# Patient Record
Sex: Male | Born: 1965
Health system: Southern US, Community
[De-identification: ages and names within clinical notes are randomized; demographics above are authoritative.]

## PROBLEM LIST (undated history)

## (undated) DIAGNOSIS — B20 Human immunodeficiency virus [HIV] disease: Secondary | ICD-10-CM

## (undated) DIAGNOSIS — Z21 Asymptomatic human immunodeficiency virus [HIV] infection status: Secondary | ICD-10-CM

## (undated) HISTORY — PX: ABDOMINAL SURGERY: SHX537

---

## 2020-01-01 ENCOUNTER — Ambulatory Visit: Payer: Medicaid Other | Admitting: *Deleted

## 2020-01-01 ENCOUNTER — Ambulatory Visit: Payer: Medicaid Other | Attending: Critical Care Medicine

## 2020-01-01 ENCOUNTER — Other Ambulatory Visit: Payer: Self-pay

## 2020-01-01 DIAGNOSIS — Z23 Encounter for immunization: Secondary | ICD-10-CM

## 2020-01-01 NOTE — Progress Notes (Signed)
   Covid-19 Vaccination Clinic  Name:  Mark Benton    MRN: [id number] DOB: 07/09/1965  02/03/2020  Mr. Mark Benton was observed post Covid-19 immunization for without incident. He was provided with Vaccine Information Sheet and instruction to access the V-Safe system.   Mr. Mark Benton was instructed to call 911 with any severe reactions post vaccine: Difficulty breathing  Swelling of face and throat  A fast heartbeat  A bad rash all over body  Dizziness and weakness   Immunizations Administered     Name Date Dose VIS Date Route   Moderna COVID-19 Vaccine 01/01/2020  4:40 PM 0.5 mL 04/2019 Intramuscular   Manufacturer: Moderna   Lot: [id number]   NDC: 24235-361-44

## 2020-01-08 ENCOUNTER — Observation Stay (HOSPITAL_COMMUNITY)
Admission: EM | Admit: 2020-01-08 | Discharge: 2020-01-09 | Disposition: A | Discharge status: Home / Self Care | Payer: Medicaid Other | Attending: Internal Medicine | Admitting: Internal Medicine

## 2020-01-08 ENCOUNTER — Emergency Department (HOSPITAL_COMMUNITY): Payer: Medicaid Other

## 2020-01-08 ENCOUNTER — Encounter (HOSPITAL_COMMUNITY): Payer: Self-pay

## 2020-01-08 ENCOUNTER — Other Ambulatory Visit: Payer: Self-pay

## 2020-01-08 DIAGNOSIS — Z21 Asymptomatic human immunodeficiency virus [HIV] infection status: Secondary | ICD-10-CM | POA: Diagnosis present

## 2020-01-08 DIAGNOSIS — R0789 Other chest pain: Secondary | ICD-10-CM

## 2020-01-08 DIAGNOSIS — Z20822 Contact with and (suspected) exposure to covid-19: Secondary | ICD-10-CM | POA: Diagnosis not present

## 2020-01-08 DIAGNOSIS — E876 Hypokalemia: Secondary | ICD-10-CM | POA: Diagnosis not present

## 2020-01-08 DIAGNOSIS — R079 Chest pain, unspecified: Principal | ICD-10-CM | POA: Insufficient documentation

## 2020-01-08 DIAGNOSIS — E785 Hyperlipidemia, unspecified: Secondary | ICD-10-CM | POA: Diagnosis present

## 2020-01-08 DIAGNOSIS — R509 Fever, unspecified: Secondary | ICD-10-CM | POA: Insufficient documentation

## 2020-01-08 DIAGNOSIS — F1410 Cocaine abuse, uncomplicated: Secondary | ICD-10-CM | POA: Diagnosis not present

## 2020-01-08 DIAGNOSIS — N390 Urinary tract infection, site not specified: Secondary | ICD-10-CM | POA: Diagnosis present

## 2020-01-08 DIAGNOSIS — N3000 Acute cystitis without hematuria: Secondary | ICD-10-CM

## 2020-01-08 DIAGNOSIS — Z79899 Other long term (current) drug therapy: Secondary | ICD-10-CM | POA: Insufficient documentation

## 2020-01-08 DIAGNOSIS — B20 Human immunodeficiency virus [HIV] disease: Secondary | ICD-10-CM | POA: Diagnosis present

## 2020-01-08 HISTORY — DX: Human immunodeficiency virus (HIV) disease: B20

## 2020-01-08 HISTORY — DX: Asymptomatic human immunodeficiency virus (hiv) infection status: Z21

## 2020-01-08 LAB — COMPREHENSIVE METABOLIC PANEL
ALT: 33 U/L (ref 0–44)
AST: 49 U/L — ABNORMAL HIGH (ref 15–41)
Albumin: 3 g/dL — ABNORMAL LOW (ref 3.5–5.0)
Alkaline Phosphatase: 38 U/L (ref 38–126)
Anion gap: 11 (ref 5–15)
BUN: 10 mg/dL (ref 6–20)
CO2: 22 mmol/L (ref 22–32)
Calcium: 8.6 mg/dL — ABNORMAL LOW (ref 8.9–10.3)
Chloride: 101 mmol/L (ref 98–111)
Creatinine, Ser: 1.19 mg/dL (ref 0.61–1.24)
GFR calc Af Amer: >60 mL/min (ref >60)
GFR calc non Af Amer: >60 mL/min (ref >60)
Glucose, Bld: 100 mg/dL — ABNORMAL HIGH (ref 70–99)
Potassium: 3.1 mmol/L — ABNORMAL LOW (ref 3.5–5.1)
Sodium: 134 mmol/L — ABNORMAL LOW (ref 135–145)
Total Bilirubin: 0.9 mg/dL (ref 0.3–1.2)
Total Protein: 7.8 g/dL (ref 6.5–8.1)

## 2020-01-08 LAB — URINALYSIS, ROUTINE W REFLEX MICROSCOPIC
Bilirubin Urine: NEGATIVE
Glucose, UA: NEGATIVE mg/dL
Hgb urine dipstick: NEGATIVE
Ketones, ur: NEGATIVE mg/dL
Nitrite: POSITIVE — AB
Protein, ur: NEGATIVE mg/dL
Specific Gravity, Urine: 1.013 (ref 1.005–1.030)
pH: 5 (ref 5.0–8.0)

## 2020-01-08 LAB — CBC WITH DIFFERENTIAL/PLATELET
Abs Immature Granulocytes: 0.01 10*3/uL (ref 0.00–0.07)
Basophils Absolute: 0 10*3/uL (ref 0.0–0.1)
Basophils Relative: 0 %
Eosinophils Absolute: 0 10*3/uL (ref 0.0–0.5)
Eosinophils Relative: 0 %
HCT: 32.1 % — ABNORMAL LOW (ref 39.0–52.0)
Hemoglobin: 10.6 g/dL — ABNORMAL LOW (ref 13.0–17.0)
Immature Granulocytes: 0 %
Lymphocytes Relative: 14 %
Lymphs Abs: 1.1 10*3/uL (ref 0.7–4.0)
MCH: 34.1 pg — ABNORMAL HIGH (ref 26.0–34.0)
MCHC: 33 g/dL (ref 30.0–36.0)
MCV: 103.2 fL — ABNORMAL HIGH (ref 80.0–100.0)
Monocytes Absolute: 0.8 10*3/uL (ref 0.1–1.0)
Monocytes Relative: 10 %
Neutro Abs: 6 10*3/uL (ref 1.7–7.7)
Neutrophils Relative %: 76 %
Platelets: 197 10*3/uL (ref 150–400)
RBC: 3.11 MIL/uL — ABNORMAL LOW (ref 4.22–5.81)
RDW: 14.9 % (ref 11.5–15.5)
WBC: 7.9 10*3/uL (ref 4.0–10.5)
nRBC: 0 % (ref 0.0–0.2)

## 2020-01-08 LAB — RAPID URINE DRUG SCREEN, HOSP PERFORMED
Amphetamines: NOT DETECTED
Barbiturates: NOT DETECTED
Benzodiazepines: NOT DETECTED
Cocaine: POSITIVE — AB
Opiates: NOT DETECTED
Tetrahydrocannabinol: NOT DETECTED

## 2020-01-08 LAB — LIPID PANEL
Cholesterol: 121 mg/dL (ref 0–200)
HDL: 57 mg/dL (ref >40)
LDL Cholesterol: 57 mg/dL (ref 0–99)
Total CHOL/HDL Ratio: 2.1 RATIO
Triglycerides: 36 mg/dL (ref <150)
VLDL: 7 mg/dL (ref 0–40)

## 2020-01-08 LAB — TROPONIN I (HIGH SENSITIVITY)
Troponin I (High Sensitivity): 15 ng/L (ref <18)
Troponin I (High Sensitivity): 19 ng/L — ABNORMAL HIGH (ref <18)
Troponin I (High Sensitivity): 16 ng/L (ref <18)

## 2020-01-08 LAB — LACTIC ACID, PLASMA
Lactic Acid, Venous: 2.1 mmol/L (ref 0.5–1.9)
Lactic Acid, Venous: 1 mmol/L (ref 0.5–1.9)

## 2020-01-08 LAB — LIPASE, BLOOD: Lipase: 35 U/L (ref 11–51)

## 2020-01-08 LAB — ETHANOL: Alcohol, Ethyl (B): 23 mg/dL — ABNORMAL HIGH (ref <10)

## 2020-01-08 LAB — SARS CORONAVIRUS 2 BY RT PCR (HOSPITAL ORDER, PERFORMED IN ~~LOC~~ HOSPITAL LAB): SARS Coronavirus 2: NEGATIVE

## 2020-01-08 LAB — POC SARS CORONAVIRUS 2 AG -  ED: SARS Coronavirus 2 Ag: NEGATIVE

## 2020-01-08 MED ORDER — ONDANSETRON HCL 4 MG/2ML IJ SOLN: 4.0000 mg | Freq: Four times a day (QID) | INTRAMUSCULAR | Status: DC | PRN

## 2020-01-08 MED ORDER — ALUM & MAG HYDROXIDE-SIMETH 200-200-20 MG/5ML PO SUSP
30.0000 mL | Freq: Once | ORAL | Status: AC
  Administered 2020-01-08: 30 mL via ORAL
  Filled 2020-01-08: qty 30

## 2020-01-08 MED ORDER — SODIUM CHLORIDE 0.45 % IV SOLN: INTRAVENOUS | Status: DC

## 2020-01-08 MED ORDER — HEPARIN BOLUS VIA INFUSION
4000.0000 [IU] | Freq: Once | INTRAVENOUS | Status: AC
  Administered 2020-01-08: 4000 [IU] via INTRAVENOUS
  Filled 2020-01-08: qty 4000

## 2020-01-08 MED ORDER — EMTRICITAB-RILPIVIR-TENOFOV AF 200-25-25 MG PO TABS
1.0000 | ORAL_TABLET | Freq: Every day | ORAL | Status: DC
  Administered 2020-01-09: 1 via ORAL
  Filled 2020-01-08: qty 1

## 2020-01-08 MED ORDER — ACETAMINOPHEN 325 MG PO TABS: 650.0000 mg | ORAL_TABLET | ORAL | Status: DC | PRN

## 2020-01-08 MED ORDER — DARUNAVIR ETHANOLATE 800 MG PO TABS
800.0000 mg | ORAL_TABLET | Freq: Every day | ORAL | Status: DC
  Administered 2020-01-09: 800 mg via ORAL
  Filled 2020-01-08 (×2): qty 1

## 2020-01-08 MED ORDER — NITROGLYCERIN 0.4 MG SL SUBL: 0.4000 mg | SUBLINGUAL_TABLET | SUBLINGUAL | Status: DC | PRN

## 2020-01-08 MED ORDER — SODIUM CHLORIDE 0.9 % IV BOLUS
500.0000 mL | Freq: Once | INTRAVENOUS | Status: AC
  Administered 2020-01-08: 500 mL via INTRAVENOUS

## 2020-01-08 MED ORDER — IOHEXOL 350 MG/ML SOLN
100.0000 mL | Freq: Once | INTRAVENOUS | Status: AC | PRN
  Administered 2020-01-08: 100 mL via INTRAVENOUS

## 2020-01-08 MED ORDER — HYDROCODONE-ACETAMINOPHEN 5-325 MG PO TABS: 1.0000 | ORAL_TABLET | Freq: Four times a day (QID) | ORAL | Status: DC | PRN

## 2020-01-08 MED ORDER — RITONAVIR 100 MG PO TABS
100.0000 mg | ORAL_TABLET | Freq: Every day | ORAL | Status: DC
  Administered 2020-01-09: 100 mg via ORAL
  Filled 2020-01-08 (×2): qty 1

## 2020-01-08 MED ORDER — LIDOCAINE VISCOUS HCL 2 % MT SOLN
15.0000 mL | Freq: Once | OROMUCOSAL | Status: AC
  Administered 2020-01-08: 15 mL via ORAL
  Filled 2020-01-08: qty 15

## 2020-01-08 MED ORDER — SULFAMETHOXAZOLE-TRIMETHOPRIM 800-160 MG PO TABS
1.0000 | ORAL_TABLET | Freq: Every day | ORAL | Status: DC
  Administered 2020-01-08 – 2020-01-09 (×2): 1 via ORAL
  Filled 2020-01-08 (×2): qty 1

## 2020-01-08 MED ORDER — HEPARIN (PORCINE) 25000 UT/250ML-% IV SOLN
1050.0000 [IU]/h | INTRAVENOUS | Status: DC
  Administered 2020-01-08: 850 [IU]/h via INTRAVENOUS
  Filled 2020-01-08: qty 250

## 2020-01-08 NOTE — Progress Notes (Signed)
ANTICOAGULATION CONSULT NOTE - Initial Consult  Pharmacy Consult for heparin Indication: chest pain/ACS  No Known Allergies  Patient Measurements: Height: 6' (182.9 cm) Weight: 72.6 kg (160 lb) IBW/kg (Calculated) : 77.6 Heparin Dosing Weight: 72.6 kg  Vital Signs: Temp: 99.1 F (37.3 C) (08/12 1645) Temp Source: Oral (08/12 1645) BP: 107/73 (08/12 1914) Pulse Rate: 73 (08/12 1914)  Labs: Recent Labs    01/08/20 1406 01/08/20 1606  HGB 10.6*  --   HCT 32.1*  --   PLT 197  --   CREATININE 1.19  --   TROPONINIHS 15 19*    Estimated Creatinine Clearance: 71.2 mL/min (by C-G formula based on SCr of 1.19 mg/dL).   Medical History: Past Medical History:  Diagnosis Date  . HIV (human immunodeficiency virus infection) [facility])     Assessment: 54 yo male with a history of HIV presents to the ED with chest pain. High sensitivity troponin upon admission was 15 and increased to 19. PTA the patient is not on any anticoagulation. The patients hemoglobin is 10.6 and hematocrit is 32.1. The patient did not report and there were no signs or symptoms of bleeding documented. Per the hospitalist, the patient will be admitted to rule out a myocardial infarction. Pharmacy is now consulted to dose heparin for ACS.  Goal of Therapy:  Heparin level 0.3-0.7 units/ml Monitor platelets by anticoagulation protocol: Yes   Plan:  Heparin IV 4000 unit bolus x1 dose, then 850 units/hr Check 6-hr heparin level Monitor daily heparin level and CBC Monitor for signs and symptoms of bleeding  Sanda Klein, PharmD, RPh  PGY-1 Pharmacy Resident 01/08/2020 8:27 PM  Please check [email or web address] for unit-specific pharmacy phone numbers.

## 2020-01-08 NOTE — ED Provider Notes (Signed)
[facility] EMERGENCY DEPARTMENT Provider Note   CSN: [id number] Arrival date & time: 01/08/20  1356     History Chief Complaint  Patient presents with  . Chest Pain  . Shortness of Breath    Mark Benton is a 54 y.o. male presenting for evaluation of chest pain.  Patient states around 12:00 this afternoon he was working outside when he had acute onset chest pain.  He states pain radiates to his back.  He is associated shortness of breath and nausea.  He denies history of similar pain.  He states he feels like he developed a fever today.  He denies trauma, injury, cough, abdominal pain, urinary symptoms, abnormal bowel movements.  Reports a history of HIV, states he is compliant with his antivirals, however has been out of his prophylactic antibiotics for about a month.  He reports intermittent alcohol use, last drink today.  He denies drug use.  Additional history obtained from chart review.  Reviewed patient's recent visits in the [facility] system.  Patient was admitted beginning of July for possible perf'd duodenal ulcer.  Reviewed previous visits for chest pain as well as polysubstance abuse including cocaine use.  Patient recently with SI.   HPI     Past Medical History:  Diagnosis Date  . HIV (human immunodeficiency virus infection) [facility])     Patient Active Problem List   Diagnosis Date Noted  . Chest pain 01/08/2020    Past Surgical History:  Procedure Laterality Date  . ABDOMINAL SURGERY         History reviewed. No pertinent family history.  Social History   Tobacco Use  . Smoking status: Not on file  Substance Use Topics  . Alcohol use: Not on file  . Drug use: Not on file    Home Medications Prior to Admission medications   Medication Sig Start Date End Date Taking? Authorizing Provider  emtricitabine-rilpivir-tenofovir AF (ODEFSEY) 200-25-25 MG TABS tablet Take 1 tablet by mouth daily. 12/27/19 01/27/20 Yes [provider]  HYDROcodone-acetaminophen (NORCO/VICODIN) 5-325 MG tablet Take 1 tablet by mouth every 6 (six) hours as needed (for pain).  12/08/19  Yes [provider]  PREZISTA 800 MG tablet Take 800 mg by mouth daily. 10/28/19  Yes [provider]  ritonavir (NORVIR) 100 MG TABS tablet Take 100 mg by mouth daily. 10/24/19  Yes [provider]  sulfamethoxazole-trimethoprim (BACTRIM DS) 800-160 MG tablet Take 1 tablet by mouth daily. 11/25/19  Yes [provider]    Allergies    Patient has no known allergies.  Review of Systems   Review of Systems  Constitutional: Positive for fever.  Respiratory: Positive for shortness of breath.   Cardiovascular: Positive for chest pain.  All other systems reviewed and are negative.   Physical Exam Updated Vital Signs BP 107/69   Pulse 71   Temp 99.1 F (37.3 C) (Oral)   Resp 18   Ht 6' (1.829 m)   Wt 72.6 kg   SpO2 100%   BMI 21.70 kg/m   Physical Exam Vitals and nursing note reviewed.  Constitutional:      General: He is not in acute distress.    Appearance: He is well-developed.     Comments: Appears uncomfortable due to pain, otherwise nontoxic  HENT:     Head: Normocephalic and atraumatic.  Eyes:     Extraocular Movements: Extraocular movements intact.     Conjunctiva/sclera: Conjunctivae normal.     Pupils: Pupils  are equal, round, and reactive to light.  Cardiovascular:     Rate and Rhythm: Normal rate and regular rhythm.     Pulses: Normal pulses.  Pulmonary:     Effort: Pulmonary effort is normal. No respiratory distress.     Breath sounds: Normal breath sounds. No wheezing.     Comments: Clear lung sounds Abdominal:     General: There is no distension.     Palpations: Abdomen is soft. There is no mass.     Tenderness: There is abdominal tenderness. There is no guarding or rebound.     Comments: Tenderness palpation of epigastric abdomen, however also recently had surgery, scar noted.   Musculoskeletal:        General: Normal range of motion.     Cervical back: Normal range of motion and neck supple.     Right lower leg: No edema.     Left lower leg: No edema.  Skin:    General: Skin is warm and dry.     Capillary Refill: Capillary refill takes less than 2 seconds.  Neurological:     Mental Status: He is alert and oriented to person, place, and time.     ED Results / Procedures / Treatments   Labs (all labs ordered are listed, but only abnormal results are displayed) Labs Reviewed  CBC WITH DIFFERENTIAL/PLATELET - Abnormal; Notable for the following components:      Result Value   RBC 3.11 (*)    Hemoglobin 10.6 (*)    HCT 32.1 (*)    MCV 103.2 (*)    MCH 34.1 (*)    All other components within normal limits  COMPREHENSIVE METABOLIC PANEL - Abnormal; Notable for the following components:   Sodium 134 (*)    Potassium 3.1 (*)    Glucose, Bld 100 (*)    Calcium 8.6 (*)    Albumin 3.0 (*)    AST 49 (*)    All other components within normal limits  LACTIC ACID, PLASMA - Abnormal; Notable for the following components:   Lactic Acid, Venous 2.1 (*)    All other components within normal limits  ETHANOL - Abnormal; Notable for the following components:   Alcohol, Ethyl (B) 23 (*)    All other components within normal limits  TROPONIN I (HIGH SENSITIVITY) - Abnormal; Notable for the following components:   Troponin I (High Sensitivity) 19 (*)    All other components within normal limits  SARS CORONAVIRUS 2 BY RT PCR (HOSPITAL ORDER, PERFORMED IN [facility])  LACTIC ACID, PLASMA  LIPASE, BLOOD  RAPID URINE DRUG SCREEN, HOSP PERFORMED  URINALYSIS, ROUTINE W REFLEX MICROSCOPIC  POC SARS CORONAVIRUS 2 AG -  ED  TROPONIN I (HIGH SENSITIVITY)    EKG EKG Interpretation  Date/Time:  Thursday January 08 2020 14:04:19 EDT Ventricular Rate:  79 PR Interval:    QRS Duration: 76 QT Interval:  369 QTC Calculation: 423 R Axis:   47 Text  Interpretation: Sinus rhythm Abnormal T, consider ischemia, diffuse leads Confirmed by Margarita Grizzleay, Danielle [phone]([id number]) on 01/08/2020 2:51:12 PM   Radiology DG Chest Portable 1 View  Result Date: 01/08/2020 CLINICAL DATA:  Chest pain EXAM: PORTABLE CHEST 1 VIEW COMPARISON:  None. FINDINGS: The heart size and mediastinal contours are within normal limits. Both lungs are clear. No pleural effusion or pneumothorax. The visualized skeletal structures are unremarkable. IMPRESSION: No acute process in the chest. Electronically Signed   By: Guadlupe SpanishPraneil  Patel M.D.   On:  01/08/2020 14:57   CT Angio Chest/Abd/Pel for Dissection W and/or Wo Contrast  Result Date: 01/08/2020 CLINICAL DATA:  Abdominal pain.  Aortic dissection suspected. EXAM: CT ANGIOGRAPHY CHEST, ABDOMEN AND PELVIS TECHNIQUE: Non-contrast CT of the chest was initially obtained. Multidetector CT imaging through the chest, abdomen and pelvis was performed using the standard protocol during bolus administration of intravenous contrast. Multiplanar reconstructed images and MIPs were obtained and reviewed to evaluate the vascular anatomy. CONTRAST:  OMNIPAQUE IOHEXOL 350 MG/ML SOLN COMPARISON:  None. FINDINGS: CTA CHEST FINDINGS Cardiovascular: Heart is normal size. Aorta is normal caliber. No dissection. No filling defects in the pulmonary arteries to suggest pulmonary emboli. Mediastinum/Nodes: No mediastinal, hilar, or axillary adenopathy. Trachea and esophagus are unremarkable. Thyroid unremarkable. Lungs/Pleura: Minimal dependent and bibasilar atelectasis. No confluent opacities or effusions. Musculoskeletal: Chest wall soft tissues are unremarkable. No acute bony abnormality. Review of the MIP images confirms the above findings. CTA ABDOMEN AND PELVIS FINDINGS VASCULAR Aorta: Normal caliber aorta without aneurysm, dissection, vasculitis or significant stenosis. Celiac: Patent without evidence of aneurysm, dissection, vasculitis or significant stenosis. SMA:  Patent without evidence of aneurysm, dissection, vasculitis or significant stenosis. Renals: Both renal arteries are patent without evidence of aneurysm, dissection, vasculitis, fibromuscular dysplasia or significant stenosis. IMA: Patent without evidence of aneurysm, dissection, vasculitis or significant stenosis. Inflow: Patent without evidence of aneurysm, dissection, vasculitis or significant stenosis. Veins: No obvious venous abnormality within the limitations of this arterial phase study. Review of the MIP images confirms the above findings. NON-VASCULAR Hepatobiliary: No focal hepatic abnormality. Gallbladder unremarkable. Pancreas: No focal abnormality or ductal dilatation. Spleen: No focal abnormality.  Normal size. Adrenals/Urinary Tract: No adrenal abnormality. No focal renal abnormality. No stones or hydronephrosis. Urinary bladder is unremarkable. Stomach/Bowel: Stomach, large and small bowel grossly unremarkable. Normal appendix. Lymphatic: No adenopathy. Reproductive: No visible focal abnormality. Other: No free fluid or free air. Musculoskeletal: No acute bony abnormality. Review of the MIP images confirms the above findings. IMPRESSION: No evidence of aortic aneurysm or dissection. No evidence of pulmonary embolus. No acute cardiopulmonary disease. Dependent and bibasilar atelectasis. No acute findings in the abdomen or pelvis. Electronically Signed   By: Charlett Nose M.D.   On: 01/08/2020 17:48    Procedures Procedures (including critical care time)  Medications Ordered in ED Medications  nitroGLYCERIN (NITROSTAT) SL tablet 0.4 mg (has no administration in time range)  sodium chloride 0.9 % bolus 500 mL (0 mLs Intravenous Stopped 01/08/20 1743)  iohexol (OMNIPAQUE) 350 MG/ML injection 100 mL (100 mLs Intravenous Contrast Given 01/08/20 1737)    ED Course  I have reviewed the triage vital signs and the nursing notes.  Pertinent labs & imaging results that were available during my care  of the patient were reviewed by me and considered in my medical decision making (see chart for details).    MDM Rules/Calculators/A&P                          Patient presenting for evaluation of chest pain.  On exam, patient appears extremely comfortable.  However, he is also febrile to 102.7.  He reports his pain radiates to his back.  Consider viral infection such as Covid.  Consider pneumonia.  Consider dissection, although less likely in the setting of fever.  Consider pancreatitis due to patient's history of alcohol use.  Patient also recently had a laparotomy for perforated bowel, consider surgical complication/repeat perf bowl.  Additionally, patient is HIV positive, consider  atypical infection such as PCP.  Consider ACS, patient has a history of cocaine use will obtain labs, x-ray, Covid test, CT dissection study.  Rapid Covid negative, will send PCR.  EKG shows T wave abnormalities.  Unable to compare to previous.  Chest x-ray viewed interpreted by me, no pneumonia, proximal effusion, cardiomegaly.  Initial round of labs overall reassuring. hgb slightly low at 10.6, this is a change when compared to previous at 13. Consider PUD/gastritis.   Ethanol mildly elevated at 23.  Trop 17.  Will delta due to acute onset of pain.  Initial lactic elevated at 2.1.  Repeat lactic negative at 1.0.  Repeat troponin mildly elevated at 19.  Per heart pathway, recommend obvs admission for chest pain.  Patient's fever improved with Tylenol given with EMS.  On reassessment, patient reports pain is improved, now rates it a 6 out of 10.  CT dissection study negative for acute findings.  Discussed with patient, will call for admission.  Discussed with Dr. Mikeal Hawthorne from [facility] hospitalist service, patient to be admitted.  Final Clinical Impression(s) / ED Diagnoses Final diagnoses:  Chest pain, unspecified type    Rx / DC Orders ED Discharge Orders    None       Alveria Apley, PA-C 01/08/20 1844      Margarita Grizzle, MD 01/12/20 1414

## 2020-01-08 NOTE — ED Notes (Signed)
Pt refuse lactic acid drawn, ask to be pulled out from IV.

## 2020-01-08 NOTE — ED Triage Notes (Signed)
Pt bib ems from [facility] for chest pain and SOB that started around noon today while working outside. Pt also febrile at 101 with EMS. Other VSS, given 324 ASA and 1G tylenol en route. Hx of HIV. 18G RAC

## 2020-01-08 NOTE — ED Notes (Signed)
Patient transported to CT 

## 2020-01-08 NOTE — H&P (Signed)
History and Physical   Mark BrothersDarryl Kunst VFI:[id number] DOB: Oct 21, 1965 DOA: 01/08/2020  Referring MD/NP/PA: Dr. Hipolito Bayleyaniel Ray  PCP: Patient, No Pcp Per   Outpatient Specialists: [facility]  Patient coming from: Home  Chief Complaint: Chest pain or shortness of breath  HPI: Mark Benton is a 54 y.o. male with medical history significant of HIV disease who [facility], previous chest pain with some work-up, unknown cholesterol status who presents to the ER with progressive shortness of breath and chest pain that started this afternoon. Is still describing it as 6 out of 10 in the left side of the chest. Denied any radiation. No relation with food. Worsened by any activity not relieved by rest. He has been taking his HIV medications and has been doing follow-up according to him. Patient was seen in the ER evaluated with initial troponin negative 2nd troponin slightly elevated to 19. He is deemed to have some risk factors for coronary artery disease and is being admitted to the hospital for rule out MI. No nausea vomiting or diarrhea no diaphoresis..  ED Course: Temperature 102.7 blood pressure 107/73 with pulse 78 respirate of 20 oxygen sat 98% room air. White count 7.9 hemoglobin 10.6 platelets 197 sodium 134 potassium is 3.0 chloride 101 CO2 22 with calcium 8.6 creatinine 1.19. Urinalysis shows positive nitrite many bacteria WBC 20-50, urine drug screen is positive for cocaine. UTI suspected as cause of fever and cocaine as cause of chest pain most likely. Patient being admitted for rule out MI.  Review of Systems: As per HPI otherwise 10 point review of systems negative.    Past Medical History:  Diagnosis Date  . HIV (human immunodeficiency virus infection) (HCC)     Past Surgical History:  Procedure Laterality Date  . ABDOMINAL SURGERY       has no history on file for tobacco use, alcohol use, and drug use.  No  Known Allergies  History reviewed. No pertinent family history.   Prior to Admission medications   Medication Sig Start Date End Date Taking? Authorizing Provider  emtricitabine-rilpivir-tenofovir AF (ODEFSEY) 200-25-25 MG TABS tablet Take 1 tablet by mouth daily. 12/27/19 01/27/20 Yes [provider]  HYDROcodone-acetaminophen (NORCO/VICODIN) 5-325 MG tablet Take 1 tablet by mouth every 6 (six) hours as needed (for pain).  12/08/19  Yes [provider]  PREZISTA 800 MG tablet Take 800 mg by mouth daily. 10/28/19  Yes [provider]  ritonavir (NORVIR) 100 MG TABS tablet Take 100 mg by mouth daily. 10/24/19  Yes [provider]  sulfamethoxazole-trimethoprim (BACTRIM DS) 800-160 MG tablet Take 1 tablet by mouth daily. 11/25/19  Yes [provider]    Physical Exam: Vitals:   01/08/20 1715 01/08/20 1716 01/08/20 1815 01/08/20 1914  BP: 107/69  104/78 107/73  Pulse:  71 70 73  Resp:   18 16  Temp:      TempSrc:      SpO2:  100% 100% 98%  Weight:      Height:          Constitutional: NAD, calm, comfortable Vitals:   01/08/20 1715 01/08/20 1716 01/08/20 1815 01/08/20 1914  BP: 107/69  104/78 107/73  Pulse:  71 70 73  Resp:   18 16  Temp:      TempSrc:      SpO2:  100% 100% 98%  Weight:      Height:  Eyes: PERRL, lids and conjunctivae normal ENMT: Mucous membranes are moist. Posterior pharynx clear of any exudate or lesions.Normal dentition.  Neck: normal, supple, no masses, no thyromegaly Respiratory: clear to auscultation bilaterally, no wheezing, no crackles. Normal respiratory effort. No accessory muscle use.  Cardiovascular: Regular rate and rhythm, no murmurs / rubs / gallops. No extremity edema. 2+ pedal pulses. No carotid bruits.  Abdomen: no tenderness, no masses palpated. No hepatosplenomegaly. Bowel sounds positive.  Musculoskeletal: no clubbing / cyanosis. No joint deformity upper and lower extremities. Good ROM, no  contractures. Normal muscle tone.  Skin: no rashes, lesions, ulcers. No induration Neurologic: CN 2-12 grossly intact. Sensation intact, DTR normal. Strength 5/5 in all 4.  Psychiatric: Normal judgment and insight. Alert and oriented x 3. Normal mood.     Labs on Admission: I have personally reviewed following labs and imaging studies  CBC: Recent Labs  Lab 01/08/20 1406  WBC 7.9  NEUTROABS 6.0  HGB 10.6*  HCT 32.1*  MCV 103.2*  PLT 197   Basic Metabolic Panel: Recent Labs  Lab 01/08/20 1406  NA 134*  K 3.1*  CL 101  CO2 22  GLUCOSE 100*  BUN 10  CREATININE 1.19  CALCIUM 8.6*   GFR: Estimated Creatinine Clearance: 71.2 mL/min (by C-G formula based on SCr of 1.19 mg/dL). Liver Function Tests: Recent Labs  Lab 01/08/20 1406  AST 49*  ALT 33  ALKPHOS 38  BILITOT 0.9  PROT 7.8  ALBUMIN 3.0*   Recent Labs  Lab 01/08/20 1406  LIPASE 35   No results for input(s): AMMONIA in the last 168 hours. Coagulation Profile: No results for input(s): INR, PROTIME in the last 168 hours. Cardiac Enzymes: No results for input(s): CKTOTAL, CKMB, CKMBINDEX, TROPONINI in the last 168 hours. BNP (last 3 results) No results for input(s): PROBNP in the last 8760 hours. HbA1C: No results for input(s): HGBA1C in the last 72 hours. CBG: No results for input(s): GLUCAP in the last 168 hours. Lipid Profile: No results for input(s): CHOL, HDL, LDLCALC, TRIG, CHOLHDL, LDLDIRECT in the last 72 hours. Thyroid Function Tests: No results for input(s): TSH, T4TOTAL, FREET4, T3FREE, THYROIDAB in the last 72 hours. Anemia Panel: No results for input(s): VITAMINB12, FOLATE, FERRITIN, TIBC, IRON, RETICCTPCT in the last 72 hours. Urine analysis:    Component Value Date/Time   COLORURINE YELLOW 01/08/2020 1825   APPEARANCEUR HAZY (A) 01/08/2020 1825   LABSPEC 1.013 01/08/2020 1825   PHURINE 5.0 01/08/2020 1825   GLUCOSEU NEGATIVE 01/08/2020 1825   HGBUR NEGATIVE 01/08/2020 1825    BILIRUBINUR NEGATIVE 01/08/2020 1825   KETONESUR NEGATIVE 01/08/2020 1825   PROTEINUR NEGATIVE 01/08/2020 1825   NITRITE POSITIVE (A) 01/08/2020 1825   LEUKOCYTESUR TRACE (A) 01/08/2020 1825   Sepsis Labs: @LABRCNTIP (procalcitonin:4,lacticidven:4) ) Recent Results (from the past 240 hour(s))  SARS Coronavirus 2 by RT PCR (hospital order, performed in [facility] hospital lab) Nasopharyngeal Nasopharyngeal Swab     Status: None   Collection Time: 01/08/20  2:47 PM   Specimen: Nasopharyngeal Swab  Result Value Ref Range Status   SARS Coronavirus 2 NEGATIVE NEGATIVE Final    Comment: (NOTE) SARS-CoV-2 target nucleic acids are NOT DETECTED.  The SARS-CoV-2 RNA is generally detectable in upper and lower respiratory specimens during the acute phase of infection. The lowest concentration of SARS-CoV-2 viral copies this assay can detect is 250 copies / mL. A negative result does not preclude SARS-CoV-2 infection and should not be used as the sole basis for treatment  or other patient management decisions.  A negative result may occur with improper specimen collection / handling, submission of specimen other than nasopharyngeal swab, presence of viral mutation(s) within the areas targeted by this assay, and inadequate number of viral copies (<250 copies / mL). A negative result must be combined with clinical observations, patient history, and epidemiological information.  Fact Sheet for Patients:   BoilerBrush.com.cy  Fact Sheet for Healthcare Providers: https://pope.com/  This test is not yet approved or  cleared by the [place] FDA and has been authorized for detection and/or diagnosis of SARS-CoV-2 by FDA under an Emergency Use Authorization (EUA).  This EUA will remain in effect (meaning this test can be used) for the duration of the COVID-19 declaration under Section 564(b)(1) of the Act, 21 U.S.C. section 360bbb-3(b)(1), unless  the authorization is terminated or revoked sooner.  Performed at [facility], [address], [place], [place] [address]      Radiological Exams on Admission: DG Chest Portable 1 View  Result Date: 01/08/2020 CLINICAL DATA:  Chest pain EXAM: PORTABLE CHEST 1 VIEW COMPARISON:  None. FINDINGS: The heart size and mediastinal contours are within normal limits. Both lungs are clear. No pleural effusion or pneumothorax. The visualized skeletal structures are unremarkable. IMPRESSION: No acute process in the chest. Electronically Signed   By: Guadlupe Spanish M.D.   On: 01/08/2020 14:57   CT Angio Chest/Abd/Pel for Dissection W and/or Wo Contrast  Result Date: 01/08/2020 CLINICAL DATA:  Abdominal pain.  Aortic dissection suspected. EXAM: CT ANGIOGRAPHY CHEST, ABDOMEN AND PELVIS TECHNIQUE: Non-contrast CT of the chest was initially obtained. Multidetector CT imaging through the chest, abdomen and pelvis was performed using the standard protocol during bolus administration of intravenous contrast. Multiplanar reconstructed images and MIPs were obtained and reviewed to evaluate the vascular anatomy. CONTRAST:  OMNIPAQUE IOHEXOL 350 MG/ML SOLN COMPARISON:  None. FINDINGS: CTA CHEST FINDINGS Cardiovascular: Heart is normal size. Aorta is normal caliber. No dissection. No filling defects in the pulmonary arteries to suggest pulmonary emboli. Mediastinum/Nodes: No mediastinal, hilar, or axillary adenopathy. Trachea and esophagus are unremarkable. Thyroid unremarkable. Lungs/Pleura: Minimal dependent and bibasilar atelectasis. No confluent opacities or effusions. Musculoskeletal: Chest wall soft tissues are unremarkable. No acute bony abnormality. Review of the MIP images confirms the above findings. CTA ABDOMEN AND PELVIS FINDINGS VASCULAR Aorta: Normal caliber aorta without aneurysm, dissection, vasculitis or significant stenosis. Celiac: Patent without evidence of aneurysm, dissection, vasculitis or  significant stenosis. SMA: Patent without evidence of aneurysm, dissection, vasculitis or significant stenosis. Renals: Both renal arteries are patent without evidence of aneurysm, dissection, vasculitis, fibromuscular dysplasia or significant stenosis. IMA: Patent without evidence of aneurysm, dissection, vasculitis or significant stenosis. Inflow: Patent without evidence of aneurysm, dissection, vasculitis or significant stenosis. Veins: No obvious venous abnormality within the limitations of this arterial phase study. Review of the MIP images confirms the above findings. NON-VASCULAR Hepatobiliary: No focal hepatic abnormality. Gallbladder unremarkable. Pancreas: No focal abnormality or ductal dilatation. Spleen: No focal abnormality.  Normal size. Adrenals/Urinary Tract: No adrenal abnormality. No focal renal abnormality. No stones or hydronephrosis. Urinary bladder is unremarkable. Stomach/Bowel: Stomach, large and small bowel grossly unremarkable. Normal appendix. Lymphatic: No adenopathy. Reproductive: No visible focal abnormality. Other: No free fluid or free air. Musculoskeletal: No acute bony abnormality. Review of the MIP images confirms the above findings. IMPRESSION: No evidence of aortic aneurysm or dissection. No evidence of pulmonary embolus. No acute cardiopulmonary disease. Dependent and bibasilar atelectasis. No acute findings in the  abdomen or pelvis. Electronically Signed   By: Charlett Nose M.D.   On: 01/08/2020 17:48    EKG: Independently reviewed. It shows sinus rhythm with a rate of 79. Evidence of LVH by voltage criteria. Flipped T waves in the lateral leads and diffuse ST depression. No old EKG to compare.  Assessment/Plan Principal Problem:   Chest pain Active Problems:   HIV (human immunodeficiency virus infection) (HCC)   Hyperlipemia     #1 chest pain: Suspected cocaine induced non-ST elevation MI. Patient 2nd troponin is slightly elevated. I will initiate heparin drip  aspirin as well as avoiding beta-blocker. I will admit the patient to the floor for rule out MI. Will consider cardiology consult if he rules in.  #2. HIV disease: Continue with his home regimen  #3 cocaine abuse: Counseling provided.  #4 UTI: Empirically start Rocephin.  #5 hyperlipidemia: Continue with statin.  #6 hypokalemia: Replete potassium   DVT prophylaxis: Heparin drip Code Status: Full code Family Communication: No family at bedside Disposition Plan: Home Consults called: None but may need cardiology consult Admission status: Observation  Severity of Illness: The appropriate patient status for this patient is OBSERVATION. Observation status is judged to be reasonable and necessary in order to provide the required intensity of service to ensure the patient's safety. The patient's presenting symptoms, physical exam findings, and initial radiographic and laboratory data in the context of their medical condition is felt to place them at decreased risk for further clinical deterioration. Furthermore, it is anticipated that the patient will be medically stable for discharge from the hospital within 2 midnights of admission. The following factors support the patient status of observation.   " The patient's presenting symptoms include chest pain or shortness of breath. " The physical exam findings include Tachycardia. " The initial radiographic and laboratory data are Change in EKG, mildly increased Troponin.Marland Kitchen     Lonia Blood MD [facility] Pager [phone]  If 7PM-7AM, please contact night-coverage [email or web address] Password [email or web address]  01/08/2020, 8:16 PM

## 2020-01-09 ENCOUNTER — Encounter (HOSPITAL_COMMUNITY): Payer: Self-pay | Admitting: Internal Medicine

## 2020-01-09 ENCOUNTER — Observation Stay (HOSPITAL_BASED_OUTPATIENT_CLINIC_OR_DEPARTMENT_OTHER): Payer: Medicaid Other

## 2020-01-09 DIAGNOSIS — F1410 Cocaine abuse, uncomplicated: Secondary | ICD-10-CM | POA: Diagnosis not present

## 2020-01-09 DIAGNOSIS — R079 Chest pain, unspecified: Secondary | ICD-10-CM | POA: Diagnosis not present

## 2020-01-09 DIAGNOSIS — Z21 Asymptomatic human immunodeficiency virus [HIV] infection status: Secondary | ICD-10-CM | POA: Diagnosis not present

## 2020-01-09 LAB — BASIC METABOLIC PANEL
Anion gap: 9 (ref 5–15)
BUN: 9 mg/dL (ref 6–20)
CO2: 25 mmol/L (ref 22–32)
Calcium: 8.6 mg/dL — ABNORMAL LOW (ref 8.9–10.3)
Chloride: 104 mmol/L (ref 98–111)
Creatinine, Ser: 1.04 mg/dL (ref 0.61–1.24)
GFR calc Af Amer: >60 mL/min (ref >60)
GFR calc non Af Amer: >60 mL/min (ref >60)
Glucose, Bld: 102 mg/dL — ABNORMAL HIGH (ref 70–99)
Potassium: 3.3 mmol/L — ABNORMAL LOW (ref 3.5–5.1)
Sodium: 138 mmol/L (ref 135–145)

## 2020-01-09 LAB — ECHOCARDIOGRAM COMPLETE
Area-P 1/2: 5.27 cm2
Height: 73 in
S' Lateral: 2.7 cm
Weight: 2283.2 oz

## 2020-01-09 LAB — CBC
HCT: 34 % — ABNORMAL LOW (ref 39.0–52.0)
Hemoglobin: 11.1 g/dL — ABNORMAL LOW (ref 13.0–17.0)
MCH: 33.6 pg (ref 26.0–34.0)
MCHC: 32.6 g/dL (ref 30.0–36.0)
MCV: 103 fL — ABNORMAL HIGH (ref 80.0–100.0)
Platelets: 210 10*3/uL (ref 150–400)
RBC: 3.3 MIL/uL — ABNORMAL LOW (ref 4.22–5.81)
RDW: 15 % (ref 11.5–15.5)
WBC: 5.6 10*3/uL (ref 4.0–10.5)
nRBC: 0 % (ref 0.0–0.2)

## 2020-01-09 LAB — HEPARIN LEVEL (UNFRACTIONATED): Heparin Unfractionated: 0.16 IU/mL — ABNORMAL LOW (ref 0.30–0.70)

## 2020-01-09 LAB — MAGNESIUM: Magnesium: 1.5 mg/dL — ABNORMAL LOW (ref 1.7–2.4)

## 2020-01-09 MED ORDER — SULFAMETHOXAZOLE-TRIMETHOPRIM 800-160 MG PO TABS: 1.0000 | ORAL_TABLET | Freq: Every day | ORAL | 0 refills | Status: AC

## 2020-01-09 MED ORDER — SULFAMETHOXAZOLE-TRIMETHOPRIM 800-160 MG PO TABS
1.0000 | ORAL_TABLET | Freq: Two times a day (BID) | ORAL | 0 refills | Status: AC
Start: 2020-01-09 — End: 2020-01-12

## 2020-01-09 MED ORDER — MAGNESIUM OXIDE -MG SUPPLEMENT 400 (240 MG) MG PO TABS: 400.0000 mg | ORAL_TABLET | Freq: Every day | ORAL | 0 refills | Status: AC

## 2020-01-09 MED ORDER — POTASSIUM CHLORIDE CRYS ER 20 MEQ PO TBCR
40.0000 meq | EXTENDED_RELEASE_TABLET | Freq: Two times a day (BID) | ORAL | Status: DC
  Administered 2020-01-09: 40 meq via ORAL
  Filled 2020-01-09: qty 2

## 2020-01-09 MED FILL — SULFAMETHOXAZOLE-TMP DS TAB: 800-160 | 30 days supply | Qty: 36 | Fill #0

## 2020-01-09 MED FILL — MAGNESIUM OXIDE 400 MG TAB: 400 (240 MG | 3 days supply | Qty: 3 | Fill #0

## 2020-01-09 NOTE — Progress Notes (Signed)
°  Echocardiogram 2D Echocardiogram has been performed.  Delcie Roch 01/09/2020, 9:21 AM

## 2020-01-09 NOTE — Progress Notes (Addendum)
Patient scheduled for heparin draw this morning. Patient refused stating he had been stuck too many times. RN requested phlebotomy to try again at a later time.  Pharmacist notified.   0457-Patient is agreeable at this time to a blood draw around 0800.

## 2020-01-09 NOTE — Progress Notes (Signed)
ANTICOAGULATION CONSULT NOTE  Pharmacy Consult for heparin Indication: chest pain/ACS  No Known Allergies  Patient Measurements: Height: 6\' 1"  (185.4 cm) Weight: 64.7 kg (142 lb 11.2 oz) IBW/kg (Calculated) : 79.9 Heparin Dosing Weight: 64.7 kg  Vital Signs: Temp: 98.3 F (36.8 C) (08/13 0718) Temp Source: Oral (08/13 0718) BP: 99/71 (08/13 0718) Pulse Rate: 51 (08/13 0718)  Labs: Recent Labs    01/08/20 1406 01/08/20 1606 01/08/20 2236 01/09/20 0806  HGB 10.6*  --   --  11.1*  HCT 32.1*  --   --  34.0*  PLT 197  --   --  210  HEPARINUNFRC  --   --   --  0.16*  CREATININE 1.19  --   --   --   TROPONINIHS 15 19* 16  --     Estimated Creatinine Clearance: 63.4 mL/min (by C-G formula based on SCr of 1.19 mg/dL).   Medical History: Past Medical History:  Diagnosis Date   HIV (human immunodeficiency virus infection) (HCC)     Assessment: 54 yo male with history of HIV presents to the ED with chest pain. High sensitivity troponin upon admission was 15 and increased to 19. PTA, the patient is not on any anticoagulation. Pharmacy is now consulted to dose heparin for r/o ACS.  Patient initially refusing heparin levels, now resulted low this morning at 0.16. CBC stable. No bleeding or issues with infusion per discussion with RN.  Goal of Therapy:  Heparin level 0.3-0.7 units/ml Monitor platelets by anticoagulation protocol: Yes   Plan:  Increase heparin to 1050 units/hr Check 6-hr heparin level Monitor daily heparin level and CBC, s/sx bleeding F/u plans for heparin  57, PharmD, BCPS Please check AMION for all [facility] contact numbers Clinical Pharmacist 01/09/2020 11:07 AM

## 2020-01-09 NOTE — Discharge Summary (Signed)
Physician Discharge Summary  Mark Benton RCB:[id number] DOB: 09-21-1965 DOA: 01/08/2020  PCP: Patient, No Pcp Per  Admit date: 01/08/2020 Discharge date: 01/09/2020  Admitted From: Home Disposition:  Home  Recommendations for Outpatient Follow-up:  1. Follow up with PCP in 1-2 weeks  Discharge Condition:Stable CODE STATUS:Full Diet recommendation: Heart healthy   Brief/Interim Summary: 54 y.o. male with medical history significant of HIV disease who gets his care mainly through [facility], previous chest pain with some work-up, unknown cholesterol status who presents to the ER with progressive shortness of breath and chest pain that started this afternoon. Is still describing it as 6 out of 10 in the left side of the chest. Denied any radiation. No relation with food. Worsened by any activity not relieved by rest. He has been taking his HIV medications and has been doing follow-up according to him. Patient was seen in the ER evaluated with initial troponin negative 2nd troponin slightly elevated to 19. He is deemed to have some risk factors for coronary artery disease and is being admitted to the hospital for rule out MI. No nausea vomiting or diarrhea no diaphoresis..  ED Course: Temperature 102.7 blood pressure 107/73 with pulse 78 respirate of 20 oxygen sat 98% room air. White count 7.9 hemoglobin 10.6 platelets 197 sodium 134 potassium is 3.0 chloride 101 CO2 22 with calcium 8.6 creatinine 1.19. Urinalysis shows positive nitrite many bacteria WBC 20-50, urine drug screen is positive for cocaine. UTI suspected as cause of fever and cocaine as cause of chest pain most likely. Patient being admitted for rule out MI.  Discharge Diagnoses:  Principal Problem:   Chest pain Active Problems:   HIV (human immunodeficiency virus infection) (HCC)   Hyperlipemia   Cocaine abuse (HCC)   UTI (urinary tract infection)   Hypokalemia   #1 chest pain:  -Troponin remained  unremarkable serially -Chest pain resolved overnight -2d echo performed and reviewed. Normal LVEF with no wall motion abnormality -Suspected cocaine induced chest pain. Avoid beta blocker in the future -Was on empiric heparin gtt while in hospital, to be discontinued.  #2. HIV disease: Continue with his home regimen  #3 cocaine abuse: cessation done face to face.  #4 UTI: UA suggestive of UTI. Will provide 3 day supply of bactrim BID. Remained afebrile.  #5 hyperlipidemia: Continue with statin.  #6 hypokalemia/hypomagnesemia: Replaced potassium, Mg replaced at time of d/c  Discharge Instructions   Allergies as of 01/09/2020   No Known Allergies     Medication List    STOP taking these medications   HYDROcodone-acetaminophen 5-325 MG tablet Commonly known as: NORCO/VICODIN     TAKE these medications   Odefsey 200-25-25 MG Tabs tablet Generic drug: emtricitabine-rilpivir-tenofovir AF Take 1 tablet by mouth daily.   Prezista 800 MG tablet Generic drug: darunavir Take 800 mg by mouth daily.   ritonavir 100 MG Tabs tablet Commonly known as: NORVIR Take 100 mg by mouth daily.   sulfamethoxazole-trimethoprim 800-160 MG tablet Commonly known as: BACTRIM DS Take 1 tablet by mouth 2 (two) times daily for 3 days. What changed: You were already taking a medication with the same name, and this prescription was added. Make sure you understand how and when to take each.   sulfamethoxazole-trimethoprim 800-160 MG tablet Commonly known as: BACTRIM DS Take 1 tablet by mouth daily. Start taking on: January 13, 2020 What changed: These instructions start on January 13, 2020. If you are unsure what to do until then, ask your doctor or  other care provider.       Follow-up Information    Follow up with your PCP in 1-2 weeks Follow up.              No Known Allergies   Procedures/Studies: DG Chest Portable 1 View  Result Date: 01/08/2020 CLINICAL DATA:  Chest pain  EXAM: PORTABLE CHEST 1 VIEW COMPARISON:  None. FINDINGS: The heart size and mediastinal contours are within normal limits. Both lungs are clear. No pleural effusion or pneumothorax. The visualized skeletal structures are unremarkable. IMPRESSION: No acute process in the chest. Electronically Signed   By: Guadlupe SpanishPraneil  Patel M.D.   On: 01/08/2020 14:57   ECHOCARDIOGRAM COMPLETE  Result Date: 01/09/2020    ECHOCARDIOGRAM REPORT   Patient Name:   Mark Benton Date of Exam: 01/09/2020 Medical Rec #:  [id number]   Height:       73.0 in Accession #:    [id number]  Weight:       142.7 lb Date of Birth:  [birth year]   BSA:          1.864 m Patient Age:    54 years    BP:           99/71 mmHg Patient Gender: M           HR:           60 bpm. Exam Location:  Inpatient Procedure: 2D Echo Indications:    chest pain 786.50  History:        Patient has no prior history of Echocardiogram examinations.                 HIV, Signs/Symptoms:Chest Pain; Risk Factors:cocaine use.  Sonographer:    Delcie RochLauren Pennington Referring Phys: [email or web address] MOHAMMAD L GARBA IMPRESSIONS  1. Left ventricular ejection fraction, by estimation, is 65 to 70%. The left ventricle has hyperdynamic function. The left ventricle has no regional wall motion abnormalities. Left ventricular diastolic parameters were normal.  2. Right ventricular systolic function is normal. The right ventricular size is normal. There is normal pulmonary artery systolic pressure.  3. The mitral valve is normal in structure. No evidence of mitral valve regurgitation. No evidence of mitral stenosis.  4. The aortic valve is normal in structure. Aortic valve regurgitation is not visualized. No aortic stenosis is present.  5. The inferior vena cava is normal in size with greater than 50% respiratory variability, suggesting right atrial pressure of 3 mmHg. FINDINGS  Left Ventricle: Left ventricular ejection fraction, by estimation, is 65 to 70%. The left ventricle has hyperdynamic function. The left  ventricle has no regional wall motion abnormalities. The left ventricular internal cavity size was normal in size. There is no left ventricular hypertrophy. Left ventricular diastolic parameters were normal. Normal left ventricular filling pressure. Right Ventricle: The right ventricular size is normal. No increase in right ventricular wall thickness. Right ventricular systolic function is normal. There is normal pulmonary artery systolic pressure. The tricuspid regurgitant velocity is 2.30 m/s, and  with an assumed right atrial pressure of 3 mmHg, the estimated right ventricular systolic pressure is 24.2 mmHg. Left Atrium: Left atrial size was normal in size. Right Atrium: Right atrial size was normal in size. Pericardium: There is no evidence of pericardial effusion. Mitral Valve: The mitral valve is normal in structure. Normal mobility of the mitral valve leaflets. No evidence of mitral valve regurgitation. No evidence of mitral valve stenosis. Tricuspid Valve: The tricuspid valve is normal in structure. Tricuspid valve regurgitation  is trivial. No evidence of tricuspid stenosis. Aortic Valve: The aortic valve is normal in structure. Aortic valve regurgitation is not visualized. No aortic stenosis is present. Pulmonic Valve: The pulmonic valve was normal in structure. Pulmonic valve regurgitation is not visualized. No evidence of pulmonic stenosis. Aorta: The aortic root and ascending aorta are structurally normal, with no evidence of dilitation. Venous: The inferior vena cava is normal in size with greater than 50% respiratory variability, suggesting right atrial pressure of 3 mmHg. IAS/Shunts: No atrial level shunt detected by color flow Doppler.  LEFT VENTRICLE PLAX 2D LVIDd:         4.10 cm  Diastology LVIDs:         2.70 cm  LV e' lateral:   16.90 cm/s LV PW:         0.70 cm  LV E/e' lateral: 6.4 LV IVS:        1.00 cm  LV e' medial:    10.00 cm/s LVOT diam:     2.10 cm  LV E/e' medial:  10.8 LV SV:          90 LV SV Index:   48 LVOT Area:     3.46 cm  RIGHT VENTRICLE             IVC RV S prime:     14.40 cm/s  IVC diam: 2.30 cm TAPSE (M-mode): 2.6 cm LEFT ATRIUM             Index LA diam:        3.60 cm 1.93 cm/m LA Vol (A2C):   34.7 ml 18.62 ml/m LA Vol (A4C):   34.1 ml 18.29 ml/m LA Biplane Vol: 37.0 ml 19.85 ml/m  AORTIC VALVE LVOT Vmax:   119.00 cm/s LVOT Vmean:  87.800 cm/s LVOT VTI:    0.259 m  AORTA Ao Root diam: 2.80 cm Ao Asc diam:  2.70 cm MITRAL VALVE                TRICUSPID VALVE MV Area (PHT): 5.27 cm     TR Peak grad:   21.2 mmHg MV Decel Time: 144 msec     TR Vmax:        230.00 cm/s MV E velocity: 108.00 cm/s MV A velocity: 58.20 cm/s   SHUNTS MV E/A ratio:  1.86         Systemic VTI:  0.26 m                             Systemic Diam: 2.10 cm Rachelle Hora Croitoru MD Electronically signed by Thurmon Fair MD Signature Date/Time: 01/09/2020/11:00:38 AM    Final    CT Angio Chest/Abd/Pel for Dissection W and/or Wo Contrast  Result Date: 01/08/2020 CLINICAL DATA:  Abdominal pain.  Aortic dissection suspected. EXAM: CT ANGIOGRAPHY CHEST, ABDOMEN AND PELVIS TECHNIQUE: Non-contrast CT of the chest was initially obtained. Multidetector CT imaging through the chest, abdomen and pelvis was performed using the standard protocol during bolus administration of intravenous contrast. Multiplanar reconstructed images and MIPs were obtained and reviewed to evaluate the vascular anatomy. CONTRAST:  OMNIPAQUE IOHEXOL 350 MG/ML SOLN COMPARISON:  None. FINDINGS: CTA CHEST FINDINGS Cardiovascular: Heart is normal size. Aorta is normal caliber. No dissection. No filling defects in the pulmonary arteries to suggest pulmonary emboli. Mediastinum/Nodes: No mediastinal, hilar, or axillary adenopathy. Trachea and esophagus are unremarkable. Thyroid unremarkable. Lungs/Pleura: Minimal dependent and bibasilar atelectasis. No confluent opacities  or effusions. Musculoskeletal: Chest wall soft tissues are unremarkable. No  acute bony abnormality. Review of the MIP images confirms the above findings. CTA ABDOMEN AND PELVIS FINDINGS VASCULAR Aorta: Normal caliber aorta without aneurysm, dissection, vasculitis or significant stenosis. Celiac: Patent without evidence of aneurysm, dissection, vasculitis or significant stenosis. SMA: Patent without evidence of aneurysm, dissection, vasculitis or significant stenosis. Renals: Both renal arteries are patent without evidence of aneurysm, dissection, vasculitis, fibromuscular dysplasia or significant stenosis. IMA: Patent without evidence of aneurysm, dissection, vasculitis or significant stenosis. Inflow: Patent without evidence of aneurysm, dissection, vasculitis or significant stenosis. Veins: No obvious venous abnormality within the limitations of this arterial phase study. Review of the MIP images confirms the above findings. NON-VASCULAR Hepatobiliary: No focal hepatic abnormality. Gallbladder unremarkable. Pancreas: No focal abnormality or ductal dilatation. Spleen: No focal abnormality.  Normal size. Adrenals/Urinary Tract: No adrenal abnormality. No focal renal abnormality. No stones or hydronephrosis. Urinary bladder is unremarkable. Stomach/Bowel: Stomach, large and small bowel grossly unremarkable. Normal appendix. Lymphatic: No adenopathy. Reproductive: No visible focal abnormality. Other: No free fluid or free air. Musculoskeletal: No acute bony abnormality. Review of the MIP images confirms the above findings. IMPRESSION: No evidence of aortic aneurysm or dissection. No evidence of pulmonary embolus. No acute cardiopulmonary disease. Dependent and bibasilar atelectasis. No acute findings in the abdomen or pelvis. Electronically Signed   By: Charlett Nose M.D.   On: 01/08/2020 17:48    Subjective: Very eager to go home  Discharge Exam: Vitals:   01/09/20 0718 01/09/20 1139  BP: 99/71 99/64  Pulse: (!) 51 (!) 54  Resp: 15 16  Temp: 98.3 F (36.8 C) 97.6 F (36.4 C)   SpO2: 100% 98%   Vitals:   01/09/20 0032 01/09/20 0454 01/09/20 0718 01/09/20 1139  BP: (!) 92/52 105/82 99/71 99/64   Pulse: (!) 56 (!) 52 (!) 51 (!) 54  Resp: Temp: 98.5 F (36.9 C) 98.4 F (36.9 C) 98.3 F (36.8 C) 97.6 F (36.4 C)  TempSrc: Oral Oral Oral Oral  SpO2: 99% 100% 100% 98%  Weight:      Height:        General: Pt is alert, awake, not in acute distress Cardiovascular: RRR, S1/S2 +, no rubs, no gallops Respiratory: CTA bilaterally, no wheezing, no rhonchi Abdominal: Soft, NT, ND, bowel sounds + Extremities: no edema, no cyanosis   The results of significant diagnostics from this hospitalization (including imaging, microbiology, ancillary and laboratory) are listed below for reference.     Microbiology: Recent Results (from the past 240 hour(s))  SARS Coronavirus 2 by RT PCR (hospital order, performed in [facility] hospital lab) Nasopharyngeal Nasopharyngeal Swab     Status: None   Collection Time: 01/08/20  2:47 PM   Specimen: Nasopharyngeal Swab  Result Value Ref Range Status   SARS Coronavirus 2 NEGATIVE NEGATIVE Final    Comment: (NOTE) SARS-CoV-2 target nucleic acids are NOT DETECTED.  The SARS-CoV-2 RNA is generally detectable in upper and lower respiratory specimens during the acute phase of infection. The lowest concentration of SARS-CoV-2 viral copies this assay can detect is 250 copies / mL. A negative result does not preclude SARS-CoV-2 infection and should not be used as the sole basis for treatment or other patient management decisions.  A negative result may occur with improper specimen collection / handling, submission of specimen other than nasopharyngeal swab, presence of viral mutation(s) within the areas targeted by this assay, and inadequate number of viral copies (<  250 copies / mL). A negative result must be combined with clinical observations, patient history, and epidemiological information.  Fact Sheet for Patients:    BoilerBrush.com.cy  Fact Sheet for Healthcare Providers: https://pope.com/  This test is not yet approved or  cleared by the [place] FDA and has been authorized for detection and/or diagnosis of SARS-CoV-2 by FDA under an Emergency Use Authorization (EUA).  This EUA will remain in effect (meaning this test can be used) for the duration of the COVID-19 declaration under Section 564(b)(1) of the Act, 21 U.S.C. section 360bbb-3(b)(1), unless the authorization is terminated or revoked sooner.  Performed at [facility], [address], [place], [place] [address]      Labs: BNP (last 3 results) No results for input(s): BNP in the last 8760 hours. Basic Metabolic Panel: Recent Labs  Lab 01/08/20 1406 01/09/20 0806  NA 134* 138  K 3.1* 3.3*  CL 101 104  CO2 22 25  GLUCOSE 100* 102*  BUN 10 9  CREATININE 1.19 1.04  CALCIUM 8.6* 8.6*  MG  --  1.5*   Liver Function Tests: Recent Labs  Lab 01/08/20 1406  AST 49*  ALT 33  ALKPHOS 38  BILITOT 0.9  PROT 7.8  ALBUMIN 3.0*   Recent Labs  Lab 01/08/20 1406  LIPASE 35   No results for input(s): AMMONIA in the last 168 hours. CBC: Recent Labs  Lab 01/08/20 1406 01/09/20 0806  WBC 7.9 5.6  NEUTROABS 6.0  --   HGB 10.6* 11.1*  HCT 32.1* 34.0*  MCV 103.2* 103.0*  PLT 197 210   Cardiac Enzymes: No results for input(s): CKTOTAL, CKMB, CKMBINDEX, TROPONINI in the last 168 hours. BNP: Invalid input(s): POCBNP CBG: No results for input(s): GLUCAP in the last 168 hours. D-Dimer No results for input(s): DDIMER in the last 72 hours. Hgb A1c No results for input(s): HGBA1C in the last 72 hours. Lipid Profile Recent Labs    01/08/20 2236  CHOL 121  HDL 57  LDLCALC 57  TRIG 36  CHOLHDL 2.1   Thyroid function studies No results for input(s): TSH, T4TOTAL, T3FREE, THYROIDAB in the last 72 hours.  Invalid input(s): FREET3 Anemia work up No results for  input(s): VITAMINB12, FOLATE, FERRITIN, TIBC, IRON, RETICCTPCT in the last 72 hours. Urinalysis    Component Value Date/Time   COLORURINE YELLOW 01/08/2020 1825   APPEARANCEUR HAZY (A) 01/08/2020 1825   LABSPEC 1.013 01/08/2020 1825   PHURINE 5.0 01/08/2020 1825   GLUCOSEU NEGATIVE 01/08/2020 1825   HGBUR NEGATIVE 01/08/2020 1825   BILIRUBINUR NEGATIVE 01/08/2020 1825   KETONESUR NEGATIVE 01/08/2020 1825   PROTEINUR NEGATIVE 01/08/2020 1825   NITRITE POSITIVE (A) 01/08/2020 1825   LEUKOCYTESUR TRACE (A) 01/08/2020 1825   Sepsis Labs Invalid input(s): PROCALCITONIN,  WBC,  LACTICIDVEN Microbiology Recent Results (from the past 240 hour(s))  SARS Coronavirus 2 by RT PCR (hospital order, performed in [facility] hospital lab) Nasopharyngeal Nasopharyngeal Swab     Status: None   Collection Time: 01/08/20  2:47 PM   Specimen: Nasopharyngeal Swab  Result Value Ref Range Status   SARS Coronavirus 2 NEGATIVE NEGATIVE Final    Comment: (NOTE) SARS-CoV-2 target nucleic acids are NOT DETECTED.  The SARS-CoV-2 RNA is generally detectable in upper and lower respiratory specimens during the acute phase of infection. The lowest concentration of SARS-CoV-2 viral copies this assay can detect is 250 copies / mL. A negative result does not preclude SARS-CoV-2 infection and should not be  used as the sole basis for treatment or other patient management decisions.  A negative result may occur with improper specimen collection / handling, submission of specimen other than nasopharyngeal swab, presence of viral mutation(s) within the areas targeted by this assay, and inadequate number of viral copies (<250 copies / mL). A negative result must be combined with clinical observations, patient history, and epidemiological information.  Fact Sheet for Patients:   BoilerBrush.com.cy  Fact Sheet for Healthcare Providers: https://pope.com/  This test is  not yet approved or  cleared by the [place] FDA and has been authorized for detection and/or diagnosis of SARS-CoV-2 by FDA under an Emergency Use Authorization (EUA).  This EUA will remain in effect (meaning this test can be used) for the duration of the COVID-19 declaration under Section 564(b)(1) of the Act, 21 U.S.C. section 360bbb-3(b)(1), unless the authorization is terminated or revoked sooner.  Performed at [facility], [address], [place], [place] [address]    Time spent: 30 min  SIGNED:   Rickey Barbara, MD  [facility] 01/09/2020, 4:21 PM  If 7PM-7AM, please contact night-coverage

## 2020-01-09 NOTE — Progress Notes (Signed)
Patient declines a weight at this time.  Patient states he will be willing to get on the scale when he gets up.

## 2020-01-29 NOTE — Progress Notes (Signed)
Patient tolerated injection well today 

## 2022-01-14 IMAGING — CT CT ANGIO CHEST-ABD-PELV FOR DISSECTION W/ AND WO/W CM
2 of 7 series · 13 of 46 positions shown, 15 images · non-contrast
Comparison: None.

CLINICAL DATA: Abdominal pain.  Aortic dissection suspected.

EXAM:
CT ANGIOGRAPHY CHEST, ABDOMEN AND PELVIS
TECHNIQUE: Non-contrast CT of the chest was initially obtained.

[Series 6: arterial · axial · arterial · 0.67mm/px · z∈[+1034,+1560]mm · 10 of 307 slices shown, 12 images]
[im 22/307  soft-tissue]
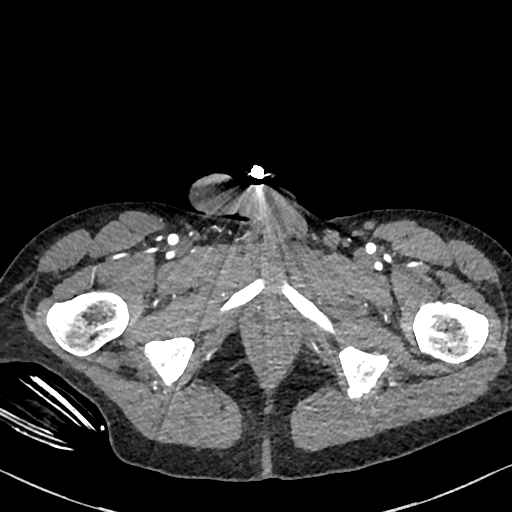
[im 22/307  bone]
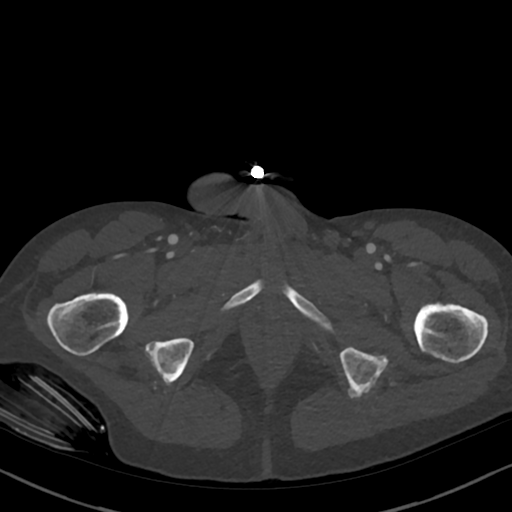
[im 44/307  soft-tissue]
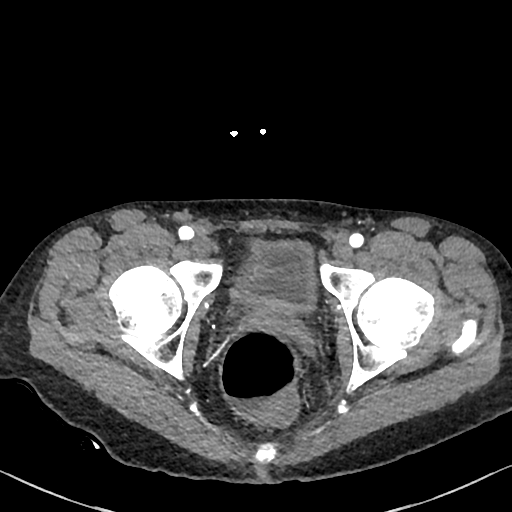
[im 88/307  soft-tissue]
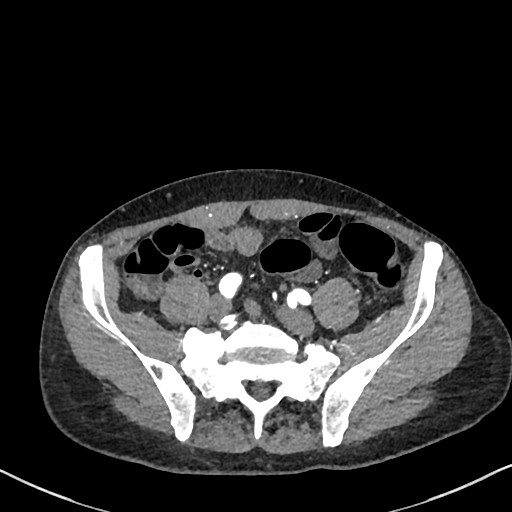
[im 110/307  soft-tissue]
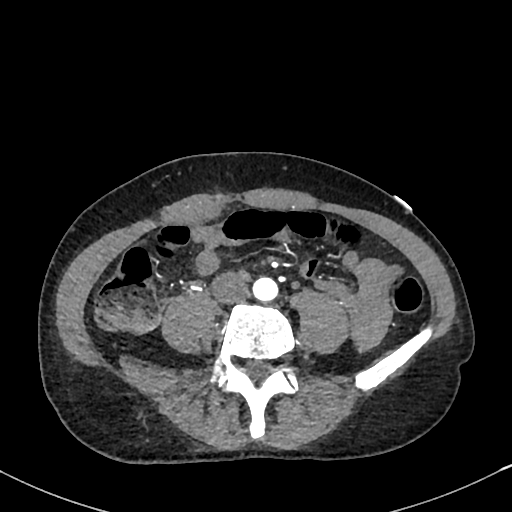
[im 132/307  soft-tissue]
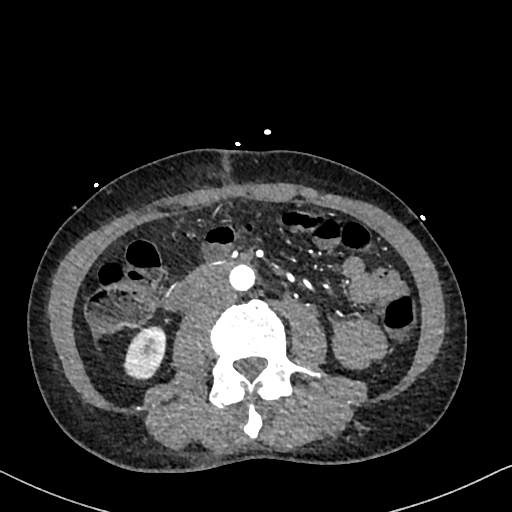
[im 175/307  soft-tissue]
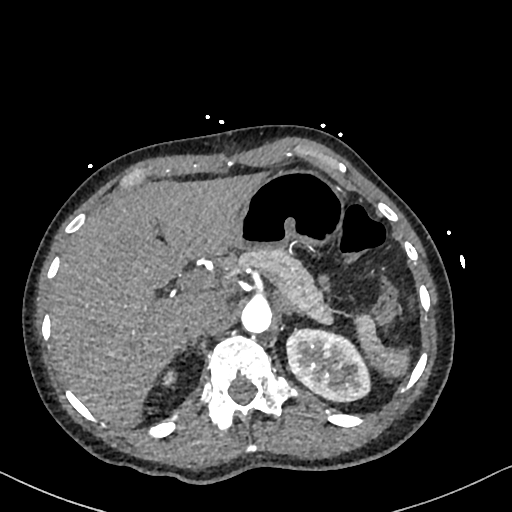
[im 197/307  soft-tissue]
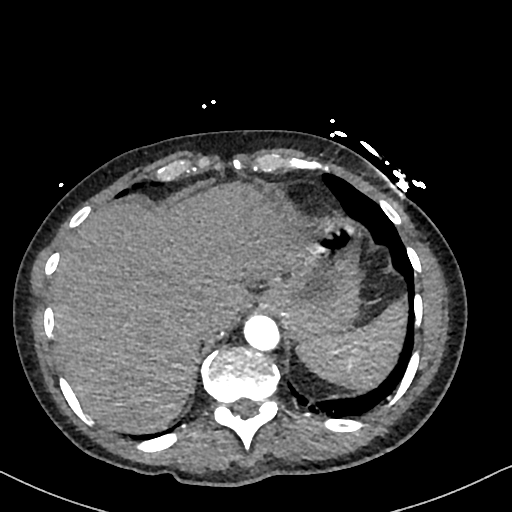
[im 219/307  soft-tissue]
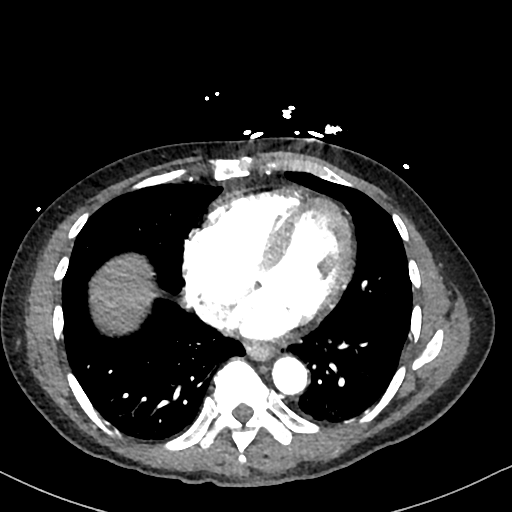
[im 263/307  soft-tissue]
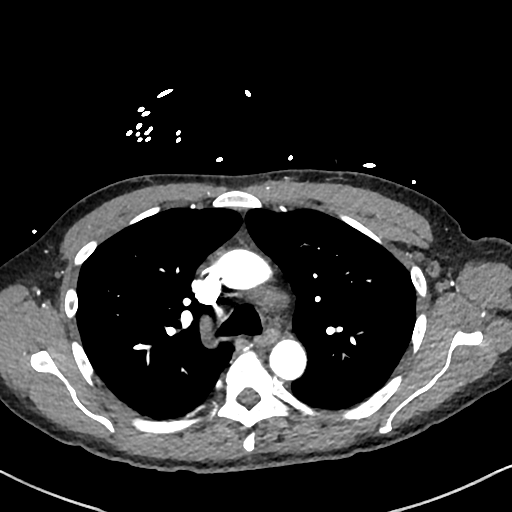
[im 263/307  bone]
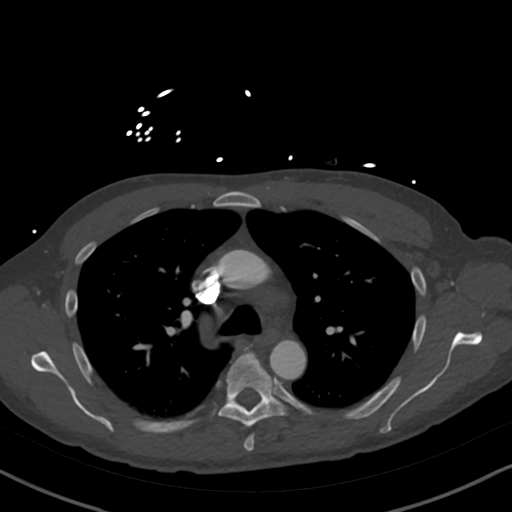
[im 285/307  soft-tissue]
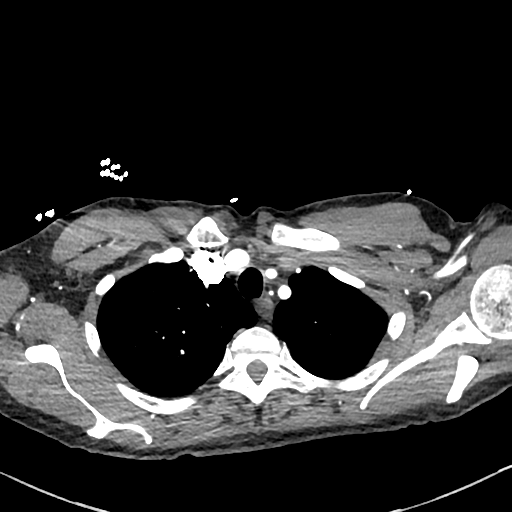

[Series 9: cor · coronal · 0.64mm/px · 3 of 150 slices shown]
[im 38/150  soft-tissue]
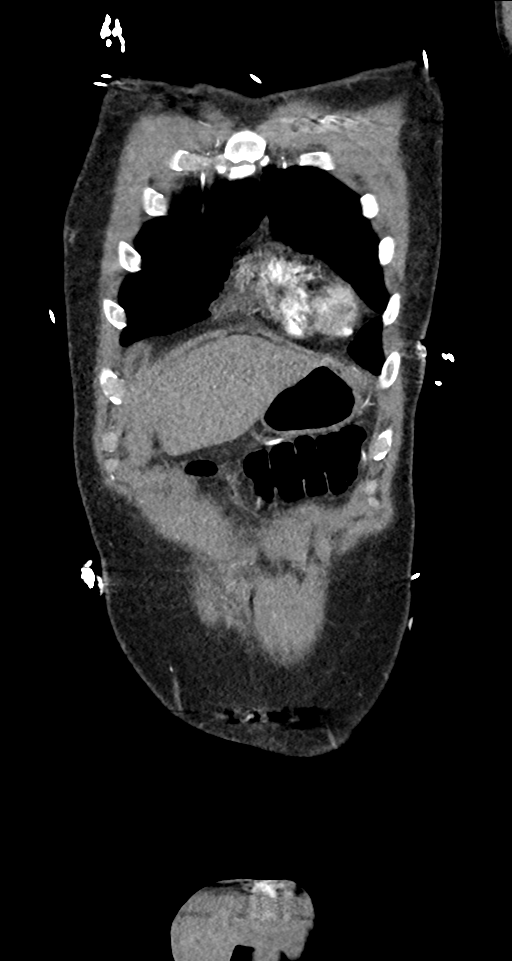
[im 75/150  soft-tissue]
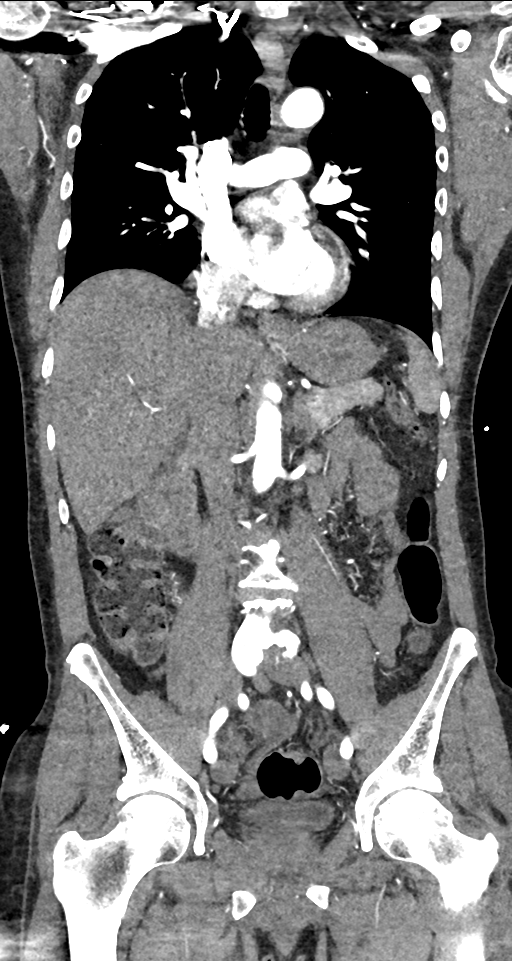
[im 112/150  soft-tissue]
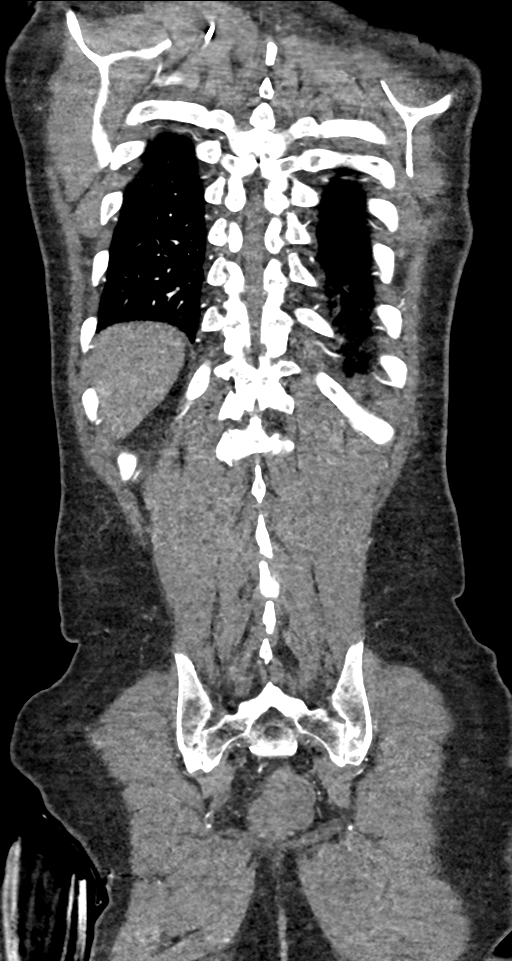

[13 of 46 positions shown; findings below may reference images not displayed]

Multidetector CT imaging through the chest, abdomen and pelvis was
performed using the standard protocol during bolus administration of
intravenous contrast. Multiplanar reconstructed images and MIPs were
obtained and reviewed to evaluate the vascular anatomy.

CONTRAST:  100mL OMNIPAQUE IOHEXOL 350 MG/ML SOLN
FINDINGS: CTA CHEST FINDINGS

Cardiovascular: Heart is normal size. Aorta is normal caliber. No
dissection. No filling defects in the pulmonary arteries to suggest
pulmonary emboli.

Mediastinum/Nodes: No mediastinal, hilar, or axillary adenopathy.
Trachea and esophagus are unremarkable. Thyroid unremarkable.

Lungs/Pleura: Minimal dependent and bibasilar atelectasis. No
confluent opacities or effusions.

Musculoskeletal: Chest wall soft tissues are unremarkable. No acute
bony abnormality.

Review of the MIP images confirms the above findings.

CTA ABDOMEN AND PELVIS FINDINGS

VASCULAR

Aorta: Normal caliber aorta without aneurysm, dissection, vasculitis
or significant stenosis.

Celiac: Patent without evidence of aneurysm, dissection, vasculitis
or significant stenosis.

SMA: Patent without evidence of aneurysm, dissection, vasculitis or
significant stenosis.

Renals: Both renal arteries are patent without evidence of aneurysm,
dissection, vasculitis, fibromuscular dysplasia or significant
stenosis.

IMA: Patent without evidence of aneurysm, dissection, vasculitis or
significant stenosis.

Inflow: Patent without evidence of aneurysm, dissection, vasculitis
or significant stenosis.

Veins: No obvious venous abnormality within the limitations of this
arterial phase study.

Review of the MIP images confirms the above findings.

NON-VASCULAR

Hepatobiliary: No focal hepatic abnormality. Gallbladder
unremarkable.

Pancreas: No focal abnormality or ductal dilatation.

Spleen: No focal abnormality.  Normal size.

Adrenals/Urinary Tract: No adrenal abnormality. No focal renal
abnormality. No stones or hydronephrosis. Urinary bladder is
unremarkable.

Stomach/Bowel: Stomach, large and small bowel grossly unremarkable.
Normal appendix.

Lymphatic: No adenopathy.

Reproductive: No visible focal abnormality.

Other: No free fluid or free air.

Musculoskeletal: No acute bony abnormality.

Review of the MIP images confirms the above findings.
IMPRESSION: No evidence of aortic aneurysm or dissection. No evidence of
pulmonary embolus.

No acute cardiopulmonary disease. Dependent and bibasilar
atelectasis.

No acute findings in the abdomen or pelvis.
